# Patient Record
Sex: Male | Born: 1965 | Race: White | Hispanic: No | Marital: Married | Smoking: Never smoker
Health system: Southern US, Community
[De-identification: ages and names within clinical notes are randomized; demographics above are authoritative.]

## PROBLEM LIST (undated history)

## (undated) DIAGNOSIS — L02219 Cutaneous abscess of trunk, unspecified: Secondary | ICD-10-CM

## (undated) HISTORY — PX: PENILE PROSTHESIS IMPLANT: SHX240

---

## 2014-09-24 ENCOUNTER — Other Ambulatory Visit: Payer: Self-pay | Admitting: Otolaryngology

## 2014-09-24 ENCOUNTER — Other Ambulatory Visit (HOSPITAL_COMMUNITY)
Admission: RE | Admit: 2014-09-24 | Discharge: 2014-09-24 | Disposition: A | Discharge status: Home / Self Care | Payer: BLUE CROSS/BLUE SHIELD | Source: Ambulatory Visit | Attending: Otolaryngology | Admitting: Otolaryngology

## 2014-09-24 DIAGNOSIS — Q180 Sinus, fistula and cyst of branchial cleft: Secondary | ICD-10-CM | POA: Insufficient documentation

## 2015-03-30 ENCOUNTER — Inpatient Hospital Stay (HOSPITAL_COMMUNITY)
Admission: EM | Admit: 2015-03-30 | Discharge: 2015-04-02 | DRG: 675 | Disposition: A | Discharge status: Home / Self Care | Payer: BLUE CROSS/BLUE SHIELD | Attending: Urology | Admitting: Urology

## 2015-03-30 ENCOUNTER — Encounter (HOSPITAL_COMMUNITY): Payer: Self-pay | Admitting: *Deleted

## 2015-03-30 DIAGNOSIS — T8361XA Infection and inflammatory reaction due to implanted penile prosthesis, initial encounter: Principal | ICD-10-CM | POA: Diagnosis present

## 2015-03-30 DIAGNOSIS — N5089 Other specified disorders of the male genital organs: Secondary | ICD-10-CM | POA: Diagnosis not present

## 2015-03-30 DIAGNOSIS — I959 Hypotension, unspecified: Secondary | ICD-10-CM | POA: Diagnosis present

## 2015-03-30 DIAGNOSIS — Y831 Surgical operation with implant of artificial internal device as the cause of abnormal reaction of the patient, or of later complication, without mention of misadventure at the time of the procedure: Secondary | ICD-10-CM | POA: Diagnosis present

## 2015-03-30 DIAGNOSIS — R509 Fever, unspecified: Secondary | ICD-10-CM

## 2015-03-30 DIAGNOSIS — R197 Diarrhea, unspecified: Secondary | ICD-10-CM | POA: Diagnosis present

## 2015-03-30 DIAGNOSIS — N4822 Cellulitis of corpus cavernosum and penis: Secondary | ICD-10-CM

## 2015-03-30 DIAGNOSIS — N4889 Other specified disorders of penis: Secondary | ICD-10-CM | POA: Diagnosis present

## 2015-03-30 LAB — COMPREHENSIVE METABOLIC PANEL
ALT: 23 U/L (ref 17–63)
ANION GAP: 14 (ref 5–15)
AST: 20 U/L (ref 15–41)
Albumin: 3.4 g/dL — ABNORMAL LOW (ref 3.5–5.0)
Alkaline Phosphatase: 120 U/L (ref 38–126)
BUN: 10 mg/dL (ref 6–20)
CHLORIDE: 92 mmol/L — AB (ref 101–111)
CO2: 27 mmol/L (ref 22–32)
Calcium: 8.8 mg/dL — ABNORMAL LOW (ref 8.9–10.3)
Creatinine, Ser: 0.97 mg/dL (ref 0.61–1.24)
Glucose, Bld: 117 mg/dL — ABNORMAL HIGH (ref 65–99)
POTASSIUM: 3.7 mmol/L (ref 3.5–5.1)
SODIUM: 133 mmol/L — AB (ref 135–145)
Total Bilirubin: 2 mg/dL — ABNORMAL HIGH (ref 0.3–1.2)
Total Protein: 7.8 g/dL (ref 6.5–8.1)

## 2015-03-30 LAB — URINALYSIS, ROUTINE W REFLEX MICROSCOPIC
Glucose, UA: NEGATIVE mg/dL
Ketones, ur: >80 mg/dL — AB
NITRITE: POSITIVE — AB
SPECIFIC GRAVITY, URINE: 1.025 (ref 1.005–1.030)
UROBILINOGEN UA: 1 mg/dL (ref 0.0–1.0)
pH: 5.5 (ref 5.0–8.0)

## 2015-03-30 LAB — CBC WITH DIFFERENTIAL/PLATELET
BASOS ABS: 0 10*3/uL (ref 0.0–0.1)
BASOS PCT: 0 %
EOS ABS: 0 10*3/uL (ref 0.0–0.7)
Eosinophils Relative: 0 %
HEMATOCRIT: 47.6 % (ref 39.0–52.0)
HEMOGLOBIN: 16 g/dL (ref 13.0–17.0)
Lymphocytes Relative: 11 %
Lymphs Abs: 2 10*3/uL (ref 0.7–4.0)
MCH: 30.9 pg (ref 26.0–34.0)
MCHC: 33.6 g/dL (ref 30.0–36.0)
MCV: 92.1 fL (ref 78.0–100.0)
MONO ABS: 2.1 10*3/uL — AB (ref 0.1–1.0)
MONOS PCT: 11 %
NEUTROS ABS: 14.7 10*3/uL — AB (ref 1.7–7.7)
NEUTROS PCT: 78 %
Platelets: 254 10*3/uL (ref 150–400)
RBC: 5.17 MIL/uL (ref 4.22–5.81)
RDW: 13.3 % (ref 11.5–15.5)
WBC: 18.9 10*3/uL — ABNORMAL HIGH (ref 4.0–10.5)

## 2015-03-30 LAB — URINE MICROSCOPIC-ADD ON

## 2015-03-30 LAB — I-STAT CG4 LACTIC ACID, ED
LACTIC ACID, VENOUS: 2.21 mmol/L — AB (ref 0.5–2.0)
LACTIC ACID, VENOUS: 1.22 mmol/L (ref 0.5–2.0)

## 2015-03-30 MED ORDER — HYDROMORPHONE HCL 1 MG/ML IJ SOLN
1.0000 mg | INTRAMUSCULAR | Status: DC | PRN
  Administered 2015-03-30 – 2015-03-31 (×5): 1 mg via INTRAVENOUS
  Filled 2015-03-30 (×5): qty 1

## 2015-03-30 MED ORDER — ACETAMINOPHEN 325 MG PO TABS
650.0000 mg | ORAL_TABLET | ORAL | Status: DC | PRN
  Administered 2015-03-31 – 2015-04-01 (×4): 650 mg via ORAL
  Filled 2015-03-30 (×6): qty 2

## 2015-03-30 MED ORDER — ONDANSETRON HCL 4 MG/2ML IJ SOLN
4.0000 mg | INTRAMUSCULAR | Status: DC | PRN
Start: 2015-03-30 — End: 2015-04-02

## 2015-03-30 MED ORDER — PIPERACILLIN-TAZOBACTAM 3.375 G IVPB
3.3750 g | Freq: Three times a day (TID) | INTRAVENOUS | Status: DC
  Administered 2015-03-31 – 2015-04-02 (×8): 3.375 g via INTRAVENOUS
  Filled 2015-03-30 (×9): qty 50

## 2015-03-30 MED ORDER — ONDANSETRON HCL 4 MG/2ML IJ SOLN
4.0000 mg | Freq: Once | INTRAMUSCULAR | Status: AC
  Administered 2015-03-30: 4 mg via INTRAVENOUS
  Filled 2015-03-30: qty 2

## 2015-03-30 MED ORDER — HYDROMORPHONE HCL 1 MG/ML IJ SOLN
1.0000 mg | Freq: Once | INTRAMUSCULAR | Status: AC
  Administered 2015-03-30: 1 mg via INTRAVENOUS
  Filled 2015-03-30: qty 1

## 2015-03-30 MED ORDER — ZOLPIDEM TARTRATE 5 MG PO TABS
5.0000 mg | ORAL_TABLET | Freq: Every evening | ORAL | Status: DC | PRN
  Administered 2015-04-01 (×2): 5 mg via ORAL
  Filled 2015-03-30 (×2): qty 1

## 2015-03-30 MED ORDER — OXYCODONE HCL 5 MG PO TABS
5.0000 mg | ORAL_TABLET | ORAL | Status: DC | PRN
  Administered 2015-03-31: 5 mg via ORAL
  Filled 2015-03-30: qty 1

## 2015-03-30 MED ORDER — SODIUM CHLORIDE 0.9 % IV BOLUS (SEPSIS)
1000.0000 mL | INTRAVENOUS | Status: AC
  Administered 2015-03-30 (×2): 1000 mL via INTRAVENOUS

## 2015-03-30 MED ORDER — DEXTROSE-NACL 5-0.45 % IV SOLN
INTRAVENOUS | Status: DC
  Administered 2015-03-31 – 2015-04-01 (×2): via INTRAVENOUS

## 2015-03-30 MED ORDER — ACETAMINOPHEN 325 MG PO TABS
650.0000 mg | ORAL_TABLET | Freq: Once | ORAL | Status: AC
  Administered 2015-03-30: 650 mg via ORAL
  Filled 2015-03-30: qty 2

## 2015-03-30 MED ORDER — SODIUM CHLORIDE 0.9 % IV BOLUS (SEPSIS): 500.0000 mL | INTRAVENOUS | Status: DC

## 2015-03-30 MED ORDER — VANCOMYCIN HCL IN DEXTROSE 1-5 GM/200ML-% IV SOLN
1000.0000 mg | Freq: Once | INTRAVENOUS | Status: AC
  Administered 2015-03-30: 1000 mg via INTRAVENOUS
  Filled 2015-03-30: qty 200

## 2015-03-30 MED ORDER — SENNOSIDES-DOCUSATE SODIUM 8.6-50 MG PO TABS: 1.0000 | ORAL_TABLET | Freq: Every evening | ORAL | Status: DC | PRN

## 2015-03-30 MED ORDER — PIPERACILLIN-TAZOBACTAM 3.375 G IVPB 30 MIN
3.3750 g | Freq: Once | INTRAVENOUS | Status: AC
  Administered 2015-03-30: 3.375 g via INTRAVENOUS
  Filled 2015-03-30: qty 50

## 2015-03-30 NOTE — ED Notes (Signed)
Pt was sent here from [facility] for fever.  Pt states he has a penile implant that was displaced Thursday.  Friday he began experiencing chills/fevers and difficulty urinating.  Temp of 101.8 at home that reduced to 100.3 after tylenol at 1200.  Pt states he called the surgeon who placed the implant (in [place]) and they scheduled surgery this coming Fri in [place].

## 2015-03-30 NOTE — ED Provider Notes (Signed)
CSN: [id number]     Arrival date & time 03/30/15  1634 History   First MD Initiated Contact with Patient 03/30/15 1841     Chief Complaint  Patient presents with  . Fever    possible r/t implant     (Consider location/radiation/quality/duration/timing/severity/associated sxs/prior Treatment) HPI   Troy Morales is a 49 y.o. male who complains of several days, pain, swelling in the penis and scrotum/perineal area. He also complains of fever which started today. He suspects that his penile augmentation device, "slipped". Since then, his penis has had a deformed appearance. He denies dysuria, urinary retention, nausea, vomiting, headache or weakness. He plans on going to [place] next week to have the device removed. He is concerned that he will not be able to make it to that appointment. No other recent illnesses. There are no other known modifying factors.   Past Medical History  Diagnosis Date  . History of penile implant    Past Surgical History  Procedure Laterality Date  . Penile prosthesis implant     No family history on file. Social History  Substance Use Topics  . Smoking status: Never Smoker   . Smokeless tobacco: None  . Alcohol Use: No    Review of Systems  All other systems reviewed and are negative.     Allergies  Review of patient's allergies indicates no known allergies.  Home Medications   Prior to Admission medications   Not on File   BP 125/72 mmHg  Pulse 113  Temp(Src) 102.1 F (38.9 C) (Oral)  Resp 18  Ht 6\' 2"  (1.88 m)  Wt 250 lb (113.399 kg)  BMI 32.08 kg/m2  SpO2 98% Physical Exam  Constitutional: He is oriented to person, place, and time. He appears well-developed and well-nourished.  HENT:  Head: Normocephalic and atraumatic.  Right Ear: External ear normal.  Left Ear: External ear normal.  Eyes: Conjunctivae and EOM are normal. Pupils are equal, round, and reactive to light.  Neck: Normal range of motion and phonation normal.  Neck supple.  Cardiovascular: Normal rate, regular rhythm and normal heart sounds.   Pulmonary/Chest: Effort normal and breath sounds normal. He exhibits no bony tenderness.  Abdominal: Soft. There is no tenderness.  Genitourinary:  Marked perineal swelling with distortion of the penis and scrotum. No urethral discharge. No groin swelling or tenderness.  Musculoskeletal: Normal range of motion.  Neurological: He is alert and oriented to person, place, and time. No cranial nerve deficit or sensory deficit. He exhibits normal muscle tone. Coordination normal.  Skin: Skin is warm, dry and intact.  Psychiatric: He has a normal mood and affect. His behavior is normal. Judgment and thought content normal.  Nursing note and vitals reviewed.   ED Course  Procedures (including critical care time)        Labs Review Labs Reviewed  COMPREHENSIVE METABOLIC PANEL - Abnormal; Notable for the following:    Sodium 133 (*)    Chloride 92 (*)    Glucose, Bld 117 (*)    Calcium 8.8 (*)    Albumin 3.4 (*)    Total Bilirubin 2.0 (*)    All other components within normal limits  URINALYSIS, ROUTINE W REFLEX MICROSCOPIC (NOT AT [facility]) - Abnormal; Notable for the following:    Color, Urine ORANGE (*)    APPearance CLOUDY (*)    Hgb urine dipstick MODERATE (*)    Bilirubin Urine MODERATE (*)    Ketones, ur >80 (*)    Protein, ur >  300 (*)    Nitrite POSITIVE (*)    Leukocytes, UA TRACE (*)    All other components within normal limits  CBC WITH DIFFERENTIAL/PLATELET - Abnormal; Notable for the following:    WBC 18.9 (*)    Neutro Abs 14.7 (*)    Monocytes Absolute 2.1 (*)    All other components within normal limits  URINE MICROSCOPIC-ADD ON - Abnormal; Notable for the following:    Bacteria, UA FEW (*)    All other components within normal limits  I-STAT CG4 LACTIC ACID, ED - Abnormal; Notable for the following:    Lactic Acid, Venous 2.21 (*)    All other components within normal limits    CULTURE, BLOOD (ROUTINE X 2)  CULTURE, BLOOD (ROUTINE X 2)  URINE CULTURE  I-STAT CG4 LACTIC ACID, ED    Imaging Review No results found. I have personally reviewed and evaluated these images and lab results as part of my medical decision-making.   EKG Interpretation None      MDM   Final diagnoses:  Penis, cellulitis    Cellulitis, penis, with likely infected enhancement prosthesis. Doubt severe sepsis, metabolic instability or impending vascular collapse.  Nursing Notes Reviewed/ Care Coordinated, and agree without changes. Applicable Imaging Reviewed.  Interpretation of Laboratory Data incorporated into ED treatment  Plan- consultation and admission by urology for management.   Mancel Bale, MD 04/08/15 [phone]

## 2015-03-30 NOTE — H&P (Signed)
Urology History and Physical Exam  CC: Penile/groin swelling  HPI: 49 year old male presents to the emergency room with a 4-5 day history of significant penile swelling, pain, tenderness, fever, chills. The patient states that for the past few weeks at the very least he has had tenderness of his penis and intermittent fevers. The patient's history is notable for the fact that he has an enhancement implant on the shaft of his penis, with at least 2 revisions. All of these [place] took place [place] by a penile [employer] managerenhancement surgeon. The patient called this surgeon last week, told him about his problems, and was supposed to fly out later this week to have surgery. Unfortunately, the patient has had worsening of his situation since that time. A significant penile and scrotal swelling, suprapubic discomfort, fever, chills. Fevers have been up to 102 at home. The patient does have significant penile swelling and difficulty urinating. Most of the time, he feels like he is able to get his bladder empty. He has also had diarrhea. He has not been on antibiotics.  PMH: Past Medical History  Diagnosis Date  . History of penile implant     PSH: Past Surgical History  Procedure Laterality Date  . Penile prosthesis implant      Allergies: No Known Allergies  Medications:  (Not in a hospital admission)   Social History: Social History   Social History  . Marital Status: Unknown    Spouse Name: N/A  . Number of Children: N/A  . Years of Education: N/A   Occupational History  . Not on file.   Social History Main Topics  . Smoking status: Never Smoker   . Smokeless tobacco: Not on file  . Alcohol Use: No  . Drug Use: No  . Sexual Activity: Not on file   Other Topics Concern  . Not on file   Social History Narrative  . No narrative on file    Family History: No family history on file.  Review of Systems: Positive: Penile/protal/suprapubic pain, swelling,  difficulty urinating, fever, chills, diarrhea Negative:   A further 10 point review of systems was negative except what is listed in the HPI.                  Physical Exam: @VITALS2 @ General: No acute distress.  Awake. Head:  Normocephalic.  Atraumatic. ENT:  EOMI.  Mucous membranes moist Neck:  Supple.  No lymphadenopathy. CV:  S1 present. S2 present. Regular rate. Pulmonary: Equal effort bilaterally.  Clear to auscultation bilaterally. Abdomen: Soft.  Non- tender to palpation. There is minimal erythema in his suprapubic area Skin:  Normal turgor.  No visible rash. Extremity: No gross deformity of bilateral upper extremities.  No gross deformity of                             lower extremities. Neurologic: Alert. Appropriate mood.  Penis:  circumcised.  The penis is significantly swollen and tender. There is no crepitus. There is warmth and erythema. Urethra: Orthotopic meatus. Scrotum: Anterior scrotal swelling, erythema and edema. Posterior scrotum does not seem to be in vault. Testicles: Descended bilaterally.  Difficult to palpate through the scrotum, however   Studies:  Recent Labs     03/30/15  1707  HGB  16.0  WBC  18.9*  PLT  254    Recent Labs     03/30/15  1707  NA  133*  K  3.7  CL  92*  CO2  27  BUN  10  CREATININE  0.97  CALCIUM  8.8*  GFRNONAA  >60  GFRAA  >60     No results for input(s): INR, APTT in the last 72 hours.  Invalid input(s): PT   Invalid input(s): ABG    Assessment:  History of enhancement/augmentation implant to the penis several years ago, with history of 2 revisions due to displacement. Currently, the patient appears to have an infection of this device, possibly with abscess. There is significant scrotal and penile edema, fever, leukocytosis.  Plan: I have discussed this case with the patient and his wife, who attended him today. I have let him know that he should be treated with IV antibiotics, and urgent explant of this  unusual enhancement/augmentation implant. I will transfer the patient Wonda Olds to the urology service. He will be placed on broad-spectrum IV antibiotics. He has been drinking fluids tonight, so I will look at scheduling him tomorrow for explant of this device. Both the patient and his wife agree with this plan.

## 2015-03-31 ENCOUNTER — Encounter (HOSPITAL_COMMUNITY)
Admission: EM | Disposition: A | Discharge status: Home / Self Care | Payer: Self-pay | Source: Home / Self Care | Attending: Urology

## 2015-03-31 ENCOUNTER — Observation Stay (HOSPITAL_COMMUNITY): Payer: BLUE CROSS/BLUE SHIELD | Admitting: Registered Nurse

## 2015-03-31 ENCOUNTER — Observation Stay (HOSPITAL_COMMUNITY): Payer: BLUE CROSS/BLUE SHIELD

## 2015-03-31 ENCOUNTER — Encounter (HOSPITAL_COMMUNITY): Payer: Self-pay | Admitting: Registered Nurse

## 2015-03-31 HISTORY — PX: REMOVAL OF PENILE PROSTHESIS: SHX6059

## 2015-03-31 LAB — BASIC METABOLIC PANEL
ANION GAP: 8 (ref 5–15)
BUN: 12 mg/dL (ref 6–20)
CALCIUM: 8.7 mg/dL — AB (ref 8.9–10.3)
CO2: 31 mmol/L (ref 22–32)
Chloride: 98 mmol/L — ABNORMAL LOW (ref 101–111)
Creatinine, Ser: 0.76 mg/dL (ref 0.61–1.24)
GFR calc Af Amer: >60 mL/min (ref >60)
GFR calc non Af Amer: >60 mL/min (ref >60)
GLUCOSE: 111 mg/dL — AB (ref 65–99)
Potassium: 3.6 mmol/L (ref 3.5–5.1)
Sodium: 137 mmol/L (ref 135–145)

## 2015-03-31 LAB — TYPE AND SCREEN
ABO/RH(D): O POS
Antibody Screen: NEGATIVE

## 2015-03-31 LAB — URINE CULTURE

## 2015-03-31 LAB — ABO/RH: ABO/RH(D): O POS

## 2015-03-31 LAB — SURGICAL PCR SCREEN
MRSA, PCR: NEGATIVE
STAPHYLOCOCCUS AUREUS: NEGATIVE

## 2015-03-31 SURGERY — REMOVAL, PENILE PROSTHESIS
Anesthesia: General

## 2015-03-31 MED ORDER — DEXAMETHASONE SODIUM PHOSPHATE 10 MG/ML IJ SOLN
INTRAMUSCULAR | Status: AC
  Filled 2015-03-31: qty 1

## 2015-03-31 MED ORDER — ONDANSETRON HCL 4 MG/2ML IJ SOLN
INTRAMUSCULAR | Status: DC | PRN
Start: 2015-03-31 — End: 2015-03-31
  Administered 2015-03-31: 4 mg via INTRAVENOUS

## 2015-03-31 MED ORDER — LACTATED RINGERS IV SOLN
INTRAVENOUS | Status: DC
  Administered 2015-03-31: 11:00:00 via INTRAVENOUS
  Administered 2015-03-31: 1000 mL via INTRAVENOUS

## 2015-03-31 MED ORDER — SODIUM CHLORIDE 0.9 % IR SOLN
Status: DC | PRN
  Administered 2015-03-31: 500 mL

## 2015-03-31 MED ORDER — VANCOMYCIN HCL 500 MG IV SOLR
500.0000 mg | Freq: Once | INTRAVENOUS | Status: AC
  Administered 2015-03-31: 500 mg via INTRAVENOUS
  Filled 2015-03-31: qty 500

## 2015-03-31 MED ORDER — ACETAMINOPHEN 500 MG PO TABS
1000.0000 mg | ORAL_TABLET | Freq: Once | ORAL | Status: AC
  Administered 2015-03-31: 1000 mg via ORAL
  Filled 2015-03-31: qty 2

## 2015-03-31 MED ORDER — FENTANYL CITRATE (PF) 100 MCG/2ML IJ SOLN
INTRAMUSCULAR | Status: DC | PRN
  Administered 2015-03-31 (×6): 50 ug via INTRAVENOUS

## 2015-03-31 MED ORDER — OXYCODONE HCL 5 MG PO TABS
5.0000 mg | ORAL_TABLET | Freq: Once | ORAL | Status: DC | PRN
  Administered 2015-03-31: 5 mg via ORAL
  Filled 2015-03-31: qty 1

## 2015-03-31 MED ORDER — OXYCODONE HCL 5 MG PO TABS
10.0000 mg | ORAL_TABLET | ORAL | Status: DC | PRN
  Administered 2015-03-31 – 2015-04-02 (×9): 10 mg via ORAL
  Filled 2015-03-31 (×9): qty 2

## 2015-03-31 MED ORDER — ONDANSETRON HCL 4 MG/2ML IJ SOLN
INTRAMUSCULAR | Status: AC
  Filled 2015-03-31: qty 2

## 2015-03-31 MED ORDER — SULFAMETHOXAZOLE-TRIMETHOPRIM 800-160 MG PO TABS: 1.0000 | ORAL_TABLET | Freq: Two times a day (BID) | ORAL | Status: DC

## 2015-03-31 MED ORDER — PROPOFOL 10 MG/ML IV BOLUS
INTRAVENOUS | Status: AC
  Filled 2015-03-31: qty 20

## 2015-03-31 MED ORDER — IBUPROFEN 200 MG PO TABS
400.0000 mg | ORAL_TABLET | Freq: Four times a day (QID) | ORAL | Status: DC | PRN
  Administered 2015-03-31 – 2015-04-01 (×3): 400 mg via ORAL
  Filled 2015-03-31 (×3): qty 2

## 2015-03-31 MED ORDER — FENTANYL CITRATE (PF) 250 MCG/5ML IJ SOLN
INTRAMUSCULAR | Status: AC
  Filled 2015-03-31: qty 25

## 2015-03-31 MED ORDER — FENTANYL CITRATE (PF) 100 MCG/2ML IJ SOLN
INTRAMUSCULAR | Status: AC
  Filled 2015-03-31: qty 4

## 2015-03-31 MED ORDER — MIDAZOLAM HCL 2 MG/2ML IJ SOLN
INTRAMUSCULAR | Status: AC
  Filled 2015-03-31: qty 4

## 2015-03-31 MED ORDER — VANCOMYCIN HCL IN DEXTROSE 1-5 GM/200ML-% IV SOLN
1000.0000 mg | Freq: Three times a day (TID) | INTRAVENOUS | Status: DC
  Administered 2015-03-31: 1000 mg via INTRAVENOUS
  Filled 2015-03-31 (×3): qty 200

## 2015-03-31 MED ORDER — SODIUM CHLORIDE 0.9 % IR SOLN
Status: AC
  Filled 2015-03-31: qty 1

## 2015-03-31 MED ORDER — VANCOMYCIN HCL IN DEXTROSE 1-5 GM/200ML-% IV SOLN
1000.0000 mg | Freq: Three times a day (TID) | INTRAVENOUS | Status: DC
  Administered 2015-04-01 – 2015-04-02 (×4): 1000 mg via INTRAVENOUS
  Filled 2015-03-31 (×5): qty 200

## 2015-03-31 MED ORDER — LIDOCAINE HCL (CARDIAC) 20 MG/ML IV SOLN
INTRAVENOUS | Status: AC
  Filled 2015-03-31: qty 5

## 2015-03-31 MED ORDER — HYDROMORPHONE HCL 1 MG/ML IJ SOLN
0.2500 mg | INTRAMUSCULAR | Status: DC | PRN
  Administered 2015-03-31 (×2): 0.5 mg via INTRAVENOUS

## 2015-03-31 MED ORDER — VANCOMYCIN HCL IN DEXTROSE 1-5 GM/200ML-% IV SOLN
1000.0000 mg | Freq: Once | INTRAVENOUS | Status: AC
  Administered 2015-03-31: 1000 mg via INTRAVENOUS

## 2015-03-31 MED ORDER — HYDROMORPHONE HCL 1 MG/ML IJ SOLN
INTRAMUSCULAR | Status: AC
  Filled 2015-03-31: qty 1

## 2015-03-31 MED ORDER — MIDAZOLAM HCL 5 MG/5ML IJ SOLN
INTRAMUSCULAR | Status: DC | PRN
  Administered 2015-03-31: 2 mg via INTRAVENOUS

## 2015-03-31 MED ORDER — LIDOCAINE HCL (CARDIAC) 10 MG/ML IV SOLN
INTRAVENOUS | Status: DC | PRN
  Administered 2015-03-31: 50 mg via INTRAVENOUS

## 2015-03-31 MED ORDER — PROPOFOL 10 MG/ML IV BOLUS
INTRAVENOUS | Status: AC
Start: 2015-03-31 — End: 2015-03-31
  Filled 2015-03-31: qty 20

## 2015-03-31 MED ORDER — HYDROMORPHONE HCL 2 MG/ML IJ SOLN
2.0000 mg | INTRAMUSCULAR | Status: DC | PRN
  Administered 2015-03-31 – 2015-04-02 (×16): 2 mg via INTRAVENOUS
  Filled 2015-03-31 (×16): qty 1

## 2015-03-31 MED ORDER — OXYCODONE HCL 5 MG PO TABS: 5.0000 mg | ORAL_TABLET | ORAL | Status: DC | PRN

## 2015-03-31 MED ORDER — PROPOFOL 10 MG/ML IV BOLUS
INTRAVENOUS | Status: DC | PRN
  Administered 2015-03-31: 250 mg via INTRAVENOUS

## 2015-03-31 SURGICAL SUPPLY — 39 items
BAG URINE DRAINAGE (UROLOGICAL SUPPLIES) ×3 IMPLANT
BANDAGE COBAN STERILE 2 (GAUZE/BANDAGES/DRESSINGS) IMPLANT
BENZOIN TINCTURE PRP APPL 2/3 (GAUZE/BANDAGES/DRESSINGS) ×3 IMPLANT
BLADE HEX COATED 2.75 (ELECTRODE) ×3 IMPLANT
BNDG GAUZE ELAST 4 BULKY (GAUZE/BANDAGES/DRESSINGS) ×3 IMPLANT
CATH FOLEY 2WAY SLVR  5CC 16FR (CATHETERS) ×2
CATH FOLEY 2WAY SLVR 5CC 16FR (CATHETERS) ×1 IMPLANT
CLOSURE WOUND 1/2 X4 (GAUZE/BANDAGES/DRESSINGS) ×1
COVER MAYO STAND STRL (DRAPES) ×3 IMPLANT
COVER SURGICAL LIGHT HANDLE (MISCELLANEOUS) ×6 IMPLANT
DISSECTOR ROUND CHERRY 3/8 STR (MISCELLANEOUS) IMPLANT
DRAIN CHANNEL 15F RND FF 3/16 (WOUND CARE) ×3 IMPLANT
DRAIN PENROSE 18X1/4 LTX STRL (WOUND CARE) ×3 IMPLANT
DRAPE LAPAROTOMY T 102X78X121 (DRAPES) ×3 IMPLANT
DRSG TEGADERM 4X4.75 (GAUZE/BANDAGES/DRESSINGS) ×3 IMPLANT
EVACUATOR SILICONE 100CC (DRAIN) ×3 IMPLANT
GAUZE SPONGE 4X4 12PLY STRL (GAUZE/BANDAGES/DRESSINGS) ×3 IMPLANT
GLOVE BIOGEL M 8.0 STRL (GLOVE) ×3 IMPLANT
GOWN STRL REUS W/TWL XL LVL3 (GOWN DISPOSABLE) ×3 IMPLANT
KIT BASIN OR (CUSTOM PROCEDURE TRAY) ×3 IMPLANT
NS IRRIG 1000ML POUR BTL (IV SOLUTION) ×3 IMPLANT
PACK GENERAL/GYN (CUSTOM PROCEDURE TRAY) ×3 IMPLANT
PLUG CATH AND CAP STER (CATHETERS) ×3 IMPLANT
RETRACTOR WILSON SYSTEM (INSTRUMENTS) IMPLANT
SOL PREP POV-IOD 4OZ 10% (MISCELLANEOUS) ×3 IMPLANT
SOL PREP PROV IODINE SCRUB 4OZ (MISCELLANEOUS) ×3 IMPLANT
SPONGE LAP 4X18 X RAY DECT (DISPOSABLE) IMPLANT
STRIP CLOSURE SKIN 1/2X4 (GAUZE/BANDAGES/DRESSINGS) ×2 IMPLANT
SUPPORT SCROTAL LG STRP (MISCELLANEOUS) ×2 IMPLANT
SUPPORTER ATHLETIC LG (MISCELLANEOUS) ×1
SURGILUBE 3G PEEL PACK STRL (MISCELLANEOUS) ×12 IMPLANT
SUT ETHILON 2 0 PS N (SUTURE) ×6 IMPLANT
SUT VIC AB 2-0 UR5 27 (SUTURE) ×18 IMPLANT
SUT VIC AB 3-0 SH 27 (SUTURE) ×4
SUT VIC AB 3-0 SH 27XBRD (SUTURE) ×2 IMPLANT
SYR 20CC LL (SYRINGE) ×3 IMPLANT
SYR 50ML LL SCALE MARK (SYRINGE) ×6 IMPLANT
TOWEL OR 17X26 10 PK STRL BLUE (TOWEL DISPOSABLE) ×6 IMPLANT
WATER STERILE IRR 1500ML POUR (IV SOLUTION) ×3 IMPLANT

## 2015-03-31 NOTE — Anesthesia Procedure Notes (Signed)
Procedure Name: LMA Insertion Date/Time: 03/31/2015 11:54 AM Performed by: Early OsmondEARGLE, Dillon Livermore E Pre-anesthesia Checklist: Patient identified, Emergency Drugs available, Suction available and Patient being monitored Patient Re-evaluated:Patient Re-evaluated prior to inductionOxygen Delivery Method: Circle system utilized Preoxygenation: Pre-oxygenation with 100% oxygen Intubation Type: IV induction Ventilation: Mask ventilation without difficulty LMA: LMA inserted LMA Size: 5.0 Number of attempts: 1 Placement Confirmation: positive ETCO2 and breath sounds checked- equal and bilateral Dental Injury: Teeth and Oropharynx as per pre-operative assessment

## 2015-03-31 NOTE — Anesthesia Preprocedure Evaluation (Addendum)
Anesthesia Evaluation  Patient identified by MRN, date of birth, ID band Patient awake    Reviewed: Allergy & Precautions, H&P , NPO status , Patient's Chart, lab work & pertinent test results  Airway Mallampati: II  TM Distance: >3 FB Neck ROM: Full    Dental no notable dental hx. (+) Teeth Intact, Dental Advisory Given   Pulmonary neg pulmonary ROS,    Pulmonary exam normal breath sounds clear to auscultation       Cardiovascular negative cardio ROS   Rhythm:Regular Rate:Normal     Neuro/Psych negative neurological ROS  negative psych ROS   GI/Hepatic negative GI ROS, Neg liver ROS,   Endo/Other  negative endocrine ROS  Renal/GU negative Renal ROS  negative genitourinary   Musculoskeletal   Abdominal   Peds  Hematology negative hematology ROS (+)   Anesthesia Other Findings   Reproductive/Obstetrics negative OB ROS                            Anesthesia Physical Anesthesia Plan  ASA: II  Anesthesia Plan: General   Post-op Pain Management:    Induction: Intravenous  Airway Management Planned: Oral ETT and LMA  Additional Equipment:   Intra-op Plan:   Post-operative Plan: Extubation in OR  Informed Consent: I have reviewed the patients History and Physical, chart, labs and discussed the procedure including the risks, benefits and alternatives for the proposed anesthesia with the patient or authorized representative who has indicated his/her understanding and acceptance.   Dental advisory given  Plan Discussed with: CRNA  Anesthesia Plan Comments:        Anesthesia Quick Evaluation

## 2015-03-31 NOTE — Anesthesia Postprocedure Evaluation (Signed)
  Anesthesia Post-op Note  Patient: Troy Morales  Procedure(s) Performed: Procedure(s): REMOVAL OF PENILE PROSTHESIS (N/A)  Patient Location: PACU  Anesthesia Type:General  Level of Consciousness: awake and alert   Airway and Oxygen Therapy: Patient Spontanous Breathing  Post-op Pain: Controlled  Post-op Assessment: Post-op Vital signs reviewed, Patient's Cardiovascular Status Stable and Respiratory Function Stable  Post-op Vital Signs: Reviewed  Filed Vitals:   03/31/15 1330  BP: 146/84  Pulse: 96  Temp: 36.9 C  Resp: 18    Complications: No apparent anesthesia complications

## 2015-03-31 NOTE — Op Note (Signed)
Preoperative diagnosis: Infected penile augmentation implant  Postoperative diagnosis: Same   Procedure: Excision of infected penile augmentation implant    Surgeon: Bertram MillardStephen M. Ananiah Maciolek, M.D.   Anesthesia: Gen.   Complications: None  Specimen(s): Culture of purulent drainage  Drain(s): 15 mm Blake drain in right inferior scrotum  Indications: 49 year old male with a history of a penile augmentation implant-silicone in nature. This was [place] with 2 revisions since that time. The patient [facility] yesterday with a fever and a 4 day history of swelling and tenderness of his penis and scrotum. It was felt that the patient had an infected implant. As the patient had significant issues with this, it was recommended that he have urgent management/excision of this. The patient was admitted and has been on IV antibiotics. He presents at this time for excision of this implant.    Technique and findings: The patient was properly identified in the holding area. He has been on both IV Zosyn and vancomycin. He was taken the operating room. General anesthesia was administered with the LMA. He was placed in the supine position. Genitalia and perineum as well as lower abdomen were prepped and draped. Timeout was performed.  I placed a 316 French Foley catheter quite easily through his significantly swollen/shortened penis. 20 mL of water was used to fill the balloon, as patient has a history of pulling catheters out. The bladder was drained of approximately 100 mL of urine. I then used the ninth 2 in size in the patient's infrapubic incision. The incision itself was approximately 7 cm in length. I dissected down inferiorly and posteriorly with the Bovie. I eventually came onto the pus filled cavity. Approximate 200 mL of purulent matter was removed. This was cultured for both routine and anaerobic cultures. The cavity was quite  large, and it was very easy to see this implant. The implant was easily grasped and removed without having to cut any sutures. It was evident that underneath the implant, there was a mesh sheet lying on the dorsum of the corporal bodies. Most of this was free-floating, and easily excised. However, it was an approximately 3 cm area where this mesh was tightly applied to the corporal bodies. This was carefully excised with sharp dissection. There was perhaps a tiny amount of this that was remaining at the time of the completion of the case, but I removed all of the mesh that I could see. Remaining sutures were also excised. The cavity was then carefully irrigated with antibiotic irrigation, 1 L. It was then also irrigated with 1 L of saline. Small bleeders were electrocoagulated. I then brought a 15 mm Blake drain through the right inferior scrotum through a small stab incision. This was sutured to the skin with a 2-0 nylon. Following inspection of the cavity, and finding it to be hemostatic, I then used interrupted 2-0 Vicryl's to reapproximate the subcutaneous tissue in 2 separate layers. The skin was loosely reapproximated with 2-0 nylon sutures placed in a vertical mattress fashion. At this point, dry sterile/fluff dressings were placed underneath a athletic supporter. The Foley was to dependent drainage. Per the patient tolerated procedure well. He was awakened and taken to the PACU in stable condition.

## 2015-03-31 NOTE — Progress Notes (Signed)
ANTIBIOTIC CONSULT NOTE - INITIAL  Pharmacy Consult for Vancomycin and Zosyn  Indication: Wound infection  No Known Allergies  Patient Measurements: Height: 6\' 2"  (188 cm) Weight: 255 lb 14.4 oz (116.075 kg) IBW/kg (Calculated) : 82.2 Adjusted Body Weight:   Vital Signs: Temp: 99 F (37.2 C) (10/30 2350) Temp Source: Oral (10/30 2350) BP: 147/78 mmHg (10/30 2350) Pulse Rate: 92 (10/30 2350) Intake/Output from previous day: 10/30 0701 - 10/31 0700 In: [id number] [P.O.:240; I.V.:20250] Out: -  Intake/Output from this shift: Total I/O In: [id number] [P.O.:240; I.V.:20250] Out: -   Labs:  Recent Labs  03/30/15 1707  WBC 18.9*  HGB 16.0  PLT 254  CREATININE 0.97   Estimated Creatinine Clearance: 126.2 mL/min (by C-G formula based on Cr of 0.97). No results for input(s): VANCOTROUGH, VANCOPEAK, VANCORANDOM, GENTTROUGH, GENTPEAK, GENTRANDOM, TOBRATROUGH, TOBRAPEAK, TOBRARND, AMIKACINPEAK, AMIKACINTROU, AMIKACIN in the last 72 hours.   Microbiology: No results found for this or any previous visit (from the past 720 hour(s)).  Medical History: Past Medical History  Diagnosis Date  . History of penile implant     Medications:  Anti-infectives    Start     Dose/Rate Route Frequency Ordered Stop   03/31/15 0600  vancomycin (VANCOCIN) IVPB 1000 mg/200 mL premix     1,000 mg 200 mL/hr over 60 Minutes Intravenous Every 8 hours 03/31/15 0011     03/31/15 0100  piperacillin-tazobactam (ZOSYN) IVPB 3.375 g     3.375 g 12.5 mL/hr over 240 Minutes Intravenous 3 times per day 03/30/15 2356     03/31/15 0015  vancomycin (VANCOCIN) 500 mg in sodium chloride 0.9 % 100 mL IVPB     500 mg 100 mL/hr over 60 Minutes Intravenous  Once 03/31/15 0010 03/31/15 0138   03/30/15 2015  piperacillin-tazobactam (ZOSYN) IVPB 3.375 g     3.375 g 100 mL/hr over 30 Minutes Intravenous  Once 03/30/15 2009 03/30/15 2303   03/30/15 2015  vancomycin (VANCOCIN) IVPB 1000 mg/200 mL premix     1,000 mg 200  mL/hr over 60 Minutes Intravenous  Once 03/30/15 2009 03/30/15 2303     Assessment: Patient with penile implant infection.  MD wants Vancomycin and Zosyn per pharmacy and plan for explant today.  Patient with weight > 100kg and 1gm vancomycin given in ED.  Patient with very good renal function.  Goal of Therapy:  Vancomycin trough level 15-20 mcg/ml  Zosyn based on renal function Appropriate antibiotic dosing for renal function; eradication of infection   Plan:  Measure antibiotic drug levels at steady state Follow up culture results Vancomycin 500mg  (to make 1500mg  total), then 1gm iv q8hr  Zosyn 3.375g IV Q8H infused over 4hrs.   Darlina GuysGrimsley Jr, Jacquenette ShoneJulian Crowford 03/31/2015,3:21 AM

## 2015-03-31 NOTE — Transfer of Care (Signed)
Immediate Anesthesia Transfer of Care Note  Patient: Troy Morales  Procedure(s) Performed: Procedure(s): REMOVAL OF PENILE PROSTHESIS (N/A)  Patient Location: PACU  Anesthesia Type:General  Level of Consciousness:  sedated, patient cooperative and responds to stimulation  Airway & Oxygen Therapy:Patient Spontanous Breathing and Patient connected to face mask oxgen  Post-op Assessment:  Report given to PACU RN and Post -op Vital signs reviewed and stable  Post vital signs:  Reviewed and stable  Last Vitals:  Filed Vitals:   03/31/15 1303  BP: 141/90  Pulse: 106  Temp: 37.3 C  Resp: 22    Complications: No apparent anesthesia complications

## 2015-04-01 DIAGNOSIS — T8361XA Infection and inflammatory reaction due to implanted penile prosthesis, initial encounter: Secondary | ICD-10-CM | POA: Diagnosis present

## 2015-04-01 DIAGNOSIS — R197 Diarrhea, unspecified: Secondary | ICD-10-CM | POA: Diagnosis present

## 2015-04-01 DIAGNOSIS — Y831 Surgical operation with implant of artificial internal device as the cause of abnormal reaction of the patient, or of later complication, without mention of misadventure at the time of the procedure: Secondary | ICD-10-CM | POA: Diagnosis present

## 2015-04-01 DIAGNOSIS — N5089 Other specified disorders of the male genital organs: Secondary | ICD-10-CM | POA: Diagnosis present

## 2015-04-01 DIAGNOSIS — I959 Hypotension, unspecified: Secondary | ICD-10-CM | POA: Diagnosis present

## 2015-04-01 DIAGNOSIS — N4889 Other specified disorders of penis: Secondary | ICD-10-CM | POA: Diagnosis present

## 2015-04-01 NOTE — Progress Notes (Signed)
1 Day Post-Op  Subjective:  1 - Infected Penile Augmentation Device - s/p operative removal / debridement / abscess drainage 03/13/15 if clinically infected penile augmentation device. Troy Morales 10/30 - pending. Abscess CX 10/31 - pending. On empiric Vanc + Zosyn.  Today "Troy Morales" is feeling improved. Fever curve trending down. No specific complaints. JP drianage appears to be trending down and becoming less purlulent.   Objective: Vital signs in last 24 hours: Temp:  [97.7 F (36.5 C)-101.3 F (38.5 C)] 97.7 F (36.5 C) (11/01 0538) Pulse Rate:  [75-106] 75 (11/01 0538) Resp:  [14-22] 18 (11/01 0538) BP: (120-159)/(73-90) 120/73 mmHg (11/01 0538) SpO2:  [96 %-100 %] 98 % (11/01 0538) Last BM Date: 03/30/15  Intake/Output from previous day: 10/31 0701 - 11/01 0700 In: 1496.7 [I.V.:1196.7; IV Piggyback:300] Out: 367 [Urine:290; Drains:77] Intake/Output this shift:    General appearance: alert, cooperative and appears stated age Head: Normocephalic, without obvious abnormality, atraumatic Nose: Nares normal. Septum midline. Mucosa normal. No drainage or sinus tenderness. Throat: lips, mucosa, and tongue normal; teeth and gums normal Neck: supple, symmetrical, trachea midline Back: symmetric, no curvature. ROM normal. No CVA tenderness. Resp: non-labored Cardio: Nl rate GI: soft, non-tender; bowel sounds normal; no masses,  no organomegaly Male genitalia: normal, significant shaft edema that is non-fluctent and erytehma tracking to pre-pubic fat pad that is non-fluctuent. No crepitius. No necrosis. JP with scant serosanguinout output. No expressible pus from incision sites.  Pulses: 2+ and symmetric Skin: Skin color, texture, turgor normal. No rashes or lesions Lymph nodes: Cervical, supraclavicular, and axillary nodes normal. Neurologic: Grossly normal Incision/Wound: as per above.   Lab Results:   Recent Labs  03/30/15 1707  WBC 18.9*  HGB 16.0  HCT 47.6  PLT 254    BMET  Recent Labs  03/30/15 1707 03/31/15 0442  NA 133* 137  K 3.7 3.6  CL 92* 98*  CO2 27 31  GLUCOSE 117* 111*  BUN 10 12  CREATININE 0.97 0.76  CALCIUM 8.8* 8.7*   PT/INR No results for input(s): LABPROT, INR in the last 72 hours. ABG No results for input(s): PHART, HCO3 in the last 72 hours.  Invalid input(s): PCO2, PO2  Studies/Results: Dg Chest 1 View  03/31/2015  CLINICAL DATA:  Fever and chills. EXAM: CHEST 1 VIEW COMPARISON:  None. FINDINGS: Mediastinum and hilar structures are normal. Cardiomegaly with normal pulmonary vascularity. Mild bilateral interstitial prominence noted. Mild pneumonitis cannot be excluded. No pleural effusion or pneumothorax. IMPRESSION: 1. Mild bilateral interstitial prominence. Mild pneumonitis cannot be excluded. 2. Cardiomegaly with normal pulmonary vascularity. Electronically Signed   By: Maisie Fus  Register   On: 03/31/2015 07:09    Anti-infectives: Anti-infectives    Start     Dose/Rate Route Frequency Ordered Stop   04/01/15 0200  vancomycin (VANCOCIN) IVPB 1000 mg/200 mL premix     1,000 mg 200 mL/hr over 60 Minutes Intravenous Every 8 hours 03/31/15 1850     03/31/15 1900  vancomycin (VANCOCIN) IVPB 1000 mg/200 mL premix     1,000 mg 200 mL/hr over 60 Minutes Intravenous  Once 03/31/15 1851 03/31/15 1958   03/31/15 1214  polymyxin B 500,000 Units, bacitracin 50,000 Units in sodium chloride irrigation 0.9 % 500 mL irrigation  Status:  Discontinued       As needed 03/31/15 1234 03/31/15 1259   03/31/15 0600  vancomycin (VANCOCIN) IVPB 1000 mg/200 mL premix  Status:  Discontinued     1,000 mg 200 mL/hr over 60 Minutes Intravenous Every  8 hours 03/31/15 0011 03/31/15 1850   03/31/15 0100  piperacillin-tazobactam (ZOSYN) IVPB 3.375 g     3.375 g 12.5 mL/hr over 240 Minutes Intravenous 3 times per day 03/30/15 2356     03/31/15 0015  vancomycin (VANCOCIN) 500 mg in sodium chloride 0.9 % 100 mL IVPB     500 mg 100 mL/hr over 60  Minutes Intravenous  Once 03/31/15 0010 03/31/15 0138   03/31/15 0000  sulfamethoxazole-trimethoprim (BACTRIM DS,SEPTRA DS) 800-160 MG tablet     1 tablet Oral 2 times daily 03/31/15 1303     03/30/15 2015  piperacillin-tazobactam (ZOSYN) IVPB 3.375 g     3.375 g 100 mL/hr over 30 Minutes Intravenous  Once 03/30/15 2009 03/30/15 2303   03/30/15 2015  vancomycin (VANCOCIN) IVPB 1000 mg/200 mL premix     1,000 mg 200 mL/hr over 60 Minutes Intravenous  Once 03/30/15 2009 03/30/15 2303      Assessment/Plan:  1 - Infected Penile Augmentation Device - doing well POD 1, improving clinically.  Discussed goals of fever free x 24 hrs and hopefully preliminary CX data to allow for continuation of PO ABX upon discharge. Remain in house on current ABX and JP to stay for now.   [facility], Krystol Morales 04/01/2015

## 2015-04-01 NOTE — Care Management Note (Signed)
Case Management Note  Patient Details  Name: Troy Morales MRN: [id number] Date of Birth: 09-06-1965  Subjective/Objective: 49 y/o m admitted w/Infected penile implant.POD#1.JP,iv abx,iv dilaudid.From home.No insurance will provide resources.                   Action/Plan:d/c plan home.   Expected Discharge Date:                  Expected Discharge Plan:  Home/Self Care  In-House Referral:     Discharge planning Services  CM Consult  Post Acute Care Choice:    Choice offered to:     DME Arranged:    DME Agency:     HH Arranged:    HH Agency:     Status of Service:  In process, will continue to follow  Medicare Important Message Given:    Date Medicare IM Given:    Medicare IM give by:    Date Additional Medicare IM Given:    Additional Medicare Important Message give by:     If discussed at Long Length of Stay Meetings, dates discussed:    Additional Comments:  Lanier ClamMahabir, Chianti Goh, RN 04/01/2015, 11:38 AM

## 2015-04-01 NOTE — Clinical Documentation Improvement (Signed)
Urology  Patient presented with temp of 101.3, pulse (101 /106/ 100), WBC 18.9 and lactic acid of 2.21, did the patient meet criteria for any of the following diagnoses on admit?  Thank  you    Sepsis   Sirs   Other  Clinically Undetermined  Document any associated diagnoses/conditions.   Supporting Information: Surgical removal of implant, IV Vancomycin/Zosyn   Please exercise your independent, professional judgment when responding. A specific answer is not anticipated or expected.   Thank You,  Lavonda JumboLawanda J Britaney Espaillat [facility] Information [facility] [place] [phone]

## 2015-04-02 MED ORDER — DOXYCYCLINE HYCLATE 100 MG PO TABS: 100.0000 mg | ORAL_TABLET | Freq: Two times a day (BID) | ORAL | Status: AC

## 2015-04-02 MED ORDER — OXYCODONE HCL 10 MG PO TABS: 10.0000 mg | ORAL_TABLET | ORAL | Status: DC | PRN

## 2015-04-02 MED ORDER — ZOLPIDEM TARTRATE 5 MG PO TABS: ORAL_TABLET | ORAL | Status: AC

## 2015-04-02 NOTE — Discharge Instructions (Signed)
1. Drain bulb daily  2. OK to use hot pack to scrotum/penis  3. Take all of doxycycline  4. Call for recurrent fever

## 2015-04-02 NOTE — Progress Notes (Signed)
Patient discharged to home with belongings, IVs removed, AVS and prescriptions given to patient. Discharge instructions explained, patient denied questions. Patient wanted to walk down to medical records to retrieve medical information. This RN walked down with patient to Medical Records, patient stable at time of discharge.

## 2015-04-03 LAB — CULTURE, ROUTINE-ABSCESS

## 2015-04-03 NOTE — Discharge Summary (Signed)
Patient ID: Troy Morales MRN: [id number] DOB/AGE: [birth year] [age] y.o.  Admit date: 03/30/2015 Discharge date: 04/03/2015  Primary Care Physician:  No primary care provider on file.  Discharge Diagnoses:   Present on Admission:  . Infection of penile implant (HCC) . 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     oxyCODONE 5 MG immediate release tablet  Commonly known as:  Oxy IR/ROXICODONE  Take 1 tablet (5 mg total) by mouth every 4 (four) hours as needed for moderate pain.     Oxycodone HCl 10 MG Tabs  Take 1 tablet (10 mg total) by mouth every 4 (four) hours as needed for moderate pain.     Testosterone 30 MG Misc  Place 2 each inside cheek daily.     zolpidem 5 MG tablet  Commonly known as:  AMBIEN  10 mg tabs Take 1 po qhs prn insomnia         Significant Diagnostic Studies:  Dg Chest 1 View  03/31/2015  CLINICAL DATA:  Fever and chills. EXAM: CHEST 1 VIEW COMPARISON:  None. FINDINGS: Mediastinum and hilar structures are normal. Cardiomegaly with normal pulmonary vascularity. Mild bilateral interstitial prominence noted. Mild pneumonitis cannot be excluded. No pleural effusion or pneumothorax. IMPRESSION: 1. Mild bilateral interstitial prominence. Mild pneumonitis cannot be excluded. 2. Cardiomegaly with normal pulmonary vascularity. Electronically Signed   By: Maisie Fushomas  Register   On: 03/31/2015 07:09    Brief H and P: For complete details please refer to admission H and P, but in brief the patient presented to the emergency room with a several-day history  of penile pain, swelling, redness and fever. He was admitted for IV antibiotic management and surgical management of an infected penile augmentation.  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  Physical Exam: General: Alert and awake oriented x3 not in any acute distress. HEENT: anicteric sclera, pupils reactive to light and accommodation CVS: S1-S2 clear no murmur rubs or gallops Chest: clear to auscultation bilaterally, no wheezing rales or rhonchi Abdomen: soft nontender, nondistended, normal bowel sounds, no organomegaly Extremities: no cyanosis, clubbing or edema noted bilaterally Neuro: Cranial nerves II-XII intact, no focal neurological deficits  Disposition:  Home  Diet:  No restrictions  Activity:  Gradually increase   Disposition and Follow-up:     Discharge Instructions    Discharge patient    Complete by:  As directed             He will follow-up 1 week in my office for drain removal  TESTS THAT NEED FOLLOW-UP  Follow-up of cultures  DISCHARGE FOLLOW-UP  Follow-up Information    Follow up with Chelsea Aus, MD.   Specialty:  Urology   Why:  we will call you   Contact information:   [address] [place] [address] [phone]       Time spent on Discharge:  20 minutes  Signed: Chelsea Aus 04/03/2015, 2:50 PM

## 2015-04-04 LAB — CULTURE, BLOOD (ROUTINE X 2)
CULTURE: NO GROWTH
Culture: NO GROWTH

## 2015-04-05 LAB — ANAEROBIC CULTURE

## 2015-04-30 ENCOUNTER — Other Ambulatory Visit: Payer: Self-pay | Admitting: Urology

## 2015-04-30 ENCOUNTER — Encounter (HOSPITAL_BASED_OUTPATIENT_CLINIC_OR_DEPARTMENT_OTHER): Payer: Self-pay | Admitting: *Deleted

## 2015-04-30 NOTE — Progress Notes (Signed)
NPO AFTER MN.  ARRIVE AT 0815.  NEEDS HG.   

## 2015-05-01 ENCOUNTER — Ambulatory Visit (HOSPITAL_BASED_OUTPATIENT_CLINIC_OR_DEPARTMENT_OTHER): Payer: BLUE CROSS/BLUE SHIELD | Admitting: Anesthesiology

## 2015-05-01 ENCOUNTER — Encounter (HOSPITAL_BASED_OUTPATIENT_CLINIC_OR_DEPARTMENT_OTHER)
Admission: RE | Disposition: A | Discharge status: Home / Self Care | Payer: Self-pay | Source: Ambulatory Visit | Attending: Urology

## 2015-05-01 ENCOUNTER — Ambulatory Visit (HOSPITAL_BASED_OUTPATIENT_CLINIC_OR_DEPARTMENT_OTHER)
Admission: RE | Admit: 2015-05-01 | Discharge: 2015-05-01 | Disposition: A | Discharge status: Home / Self Care | Payer: BLUE CROSS/BLUE SHIELD | Source: Ambulatory Visit | Attending: Urology | Admitting: Urology

## 2015-05-01 ENCOUNTER — Encounter (HOSPITAL_BASED_OUTPATIENT_CLINIC_OR_DEPARTMENT_OTHER): Payer: Self-pay

## 2015-05-01 DIAGNOSIS — Z6832 Body mass index (BMI) 32.0-32.9, adult: Secondary | ICD-10-CM | POA: Diagnosis not present

## 2015-05-01 DIAGNOSIS — L02211 Cutaneous abscess of abdominal wall: Secondary | ICD-10-CM | POA: Diagnosis present

## 2015-05-01 DIAGNOSIS — E669 Obesity, unspecified: Secondary | ICD-10-CM | POA: Insufficient documentation

## 2015-05-01 DIAGNOSIS — T8361XD Infection and inflammatory reaction due to implanted penile prosthesis, subsequent encounter: Secondary | ICD-10-CM

## 2015-05-01 DIAGNOSIS — Z79899 Other long term (current) drug therapy: Secondary | ICD-10-CM | POA: Diagnosis not present

## 2015-05-01 HISTORY — DX: Cutaneous abscess of trunk, unspecified: L02.219

## 2015-05-01 HISTORY — PX: INCISION AND DRAINAGE ABSCESS: SHX5864

## 2015-05-01 LAB — POCT HEMOGLOBIN-HEMACUE: Hemoglobin: 15.2 g/dL (ref 13.0–17.0)

## 2015-05-01 SURGERY — INCISION AND DRAINAGE, ABSCESS
Anesthesia: General

## 2015-05-01 MED ORDER — FENTANYL CITRATE (PF) 100 MCG/2ML IJ SOLN
INTRAMUSCULAR | Status: AC
  Filled 2015-05-01: qty 2

## 2015-05-01 MED ORDER — CEFAZOLIN SODIUM-DEXTROSE 2-3 GM-% IV SOLR
2.0000 g | INTRAVENOUS | Status: AC
  Administered 2015-05-01: 2 g via INTRAVENOUS
  Filled 2015-05-01: qty 50

## 2015-05-01 MED ORDER — CEFAZOLIN SODIUM 1-5 GM-% IV SOLN
1.0000 g | INTRAVENOUS | Status: DC
  Filled 2015-05-01: qty 50

## 2015-05-01 MED ORDER — FENTANYL CITRATE (PF) 100 MCG/2ML IJ SOLN
25.0000 ug | INTRAMUSCULAR | Status: DC | PRN
  Filled 2015-05-01: qty 1

## 2015-05-01 MED ORDER — ONDANSETRON HCL 4 MG/2ML IJ SOLN
INTRAMUSCULAR | Status: AC
  Filled 2015-05-01: qty 2

## 2015-05-01 MED ORDER — SODIUM CHLORIDE 0.9 % IR SOLN
Status: DC | PRN
  Administered 2015-05-01: 500 mL

## 2015-05-01 MED ORDER — MIDAZOLAM HCL 5 MG/5ML IJ SOLN
INTRAMUSCULAR | Status: DC | PRN
  Administered 2015-05-01: 2 mg via INTRAVENOUS

## 2015-05-01 MED ORDER — MIDAZOLAM HCL 2 MG/2ML IJ SOLN
INTRAMUSCULAR | Status: AC
  Filled 2015-05-01: qty 2

## 2015-05-01 MED ORDER — OXYCODONE-ACETAMINOPHEN 5-325 MG PO TABS: 1.0000 | ORAL_TABLET | ORAL | Status: AC | PRN

## 2015-05-01 MED ORDER — FENTANYL CITRATE (PF) 100 MCG/2ML IJ SOLN
INTRAMUSCULAR | Status: DC | PRN
  Administered 2015-05-01 (×2): 50 ug via INTRAVENOUS
  Administered 2015-05-01: 25 ug via INTRAVENOUS

## 2015-05-01 MED ORDER — PROPOFOL 10 MG/ML IV BOLUS
INTRAVENOUS | Status: AC
  Filled 2015-05-01: qty 20

## 2015-05-01 MED ORDER — ONDANSETRON HCL 4 MG/2ML IJ SOLN
INTRAMUSCULAR | Status: DC | PRN
Start: 2015-05-01 — End: 2015-05-01
  Administered 2015-05-01: 4 mg via INTRAVENOUS

## 2015-05-01 MED ORDER — DEXAMETHASONE SODIUM PHOSPHATE 4 MG/ML IJ SOLN
INTRAMUSCULAR | Status: DC | PRN
  Administered 2015-05-01: 10 mg via INTRAVENOUS

## 2015-05-01 MED ORDER — ONDANSETRON HCL 4 MG/2ML IJ SOLN
4.0000 mg | Freq: Once | INTRAMUSCULAR | Status: DC | PRN
  Filled 2015-05-01: qty 2

## 2015-05-01 MED ORDER — BUPIVACAINE HCL 0.25 % IJ SOLN
INTRAMUSCULAR | Status: DC | PRN
  Administered 2015-05-01: 10 mL

## 2015-05-01 MED ORDER — OXYCODONE-ACETAMINOPHEN 5-325 MG PO TABS
ORAL_TABLET | ORAL | Status: AC
  Filled 2015-05-01: qty 1

## 2015-05-01 MED ORDER — OXYCODONE-ACETAMINOPHEN 5-325 MG PO TABS
1.0000 | ORAL_TABLET | ORAL | Status: AC | PRN
  Administered 2015-05-01: 1 via ORAL
  Filled 2015-05-01: qty 2

## 2015-05-01 MED ORDER — DEXAMETHASONE SODIUM PHOSPHATE 10 MG/ML IJ SOLN
INTRAMUSCULAR | Status: AC
  Filled 2015-05-01: qty 1

## 2015-05-01 MED ORDER — PROPOFOL 10 MG/ML IV BOLUS
INTRAVENOUS | Status: DC | PRN
  Administered 2015-05-01: 300 mg via INTRAVENOUS

## 2015-05-01 MED ORDER — CEFAZOLIN SODIUM-DEXTROSE 2-3 GM-% IV SOLR
INTRAVENOUS | Status: AC
  Filled 2015-05-01: qty 50

## 2015-05-01 MED ORDER — LIDOCAINE HCL (CARDIAC) 20 MG/ML IV SOLN
INTRAVENOUS | Status: AC
  Filled 2015-05-01: qty 5

## 2015-05-01 MED ORDER — LIDOCAINE HCL (CARDIAC) 20 MG/ML IV SOLN
INTRAVENOUS | Status: DC | PRN
  Administered 2015-05-01: 100 mg via INTRAVENOUS

## 2015-05-01 MED ORDER — LACTATED RINGERS IV SOLN
INTRAVENOUS | Status: DC
  Administered 2015-05-01: 09:00:00 via INTRAVENOUS
  Filled 2015-05-01: qty 1000

## 2015-05-01 MED ORDER — KETOROLAC TROMETHAMINE 30 MG/ML IJ SOLN
INTRAMUSCULAR | Status: AC
  Filled 2015-05-01: qty 1

## 2015-05-01 SURGICAL SUPPLY — 33 items
BANDAGE CO FLEX L/F 1IN X 5YD (GAUZE/BANDAGES/DRESSINGS) IMPLANT
BLADE SURG 15 STRL LF DISP TIS (BLADE) ×1 IMPLANT
BLADE SURG 15 STRL SS (BLADE) ×2
BNDG CONFORM 2 STRL LF (GAUZE/BANDAGES/DRESSINGS) IMPLANT
BNDG GAUZE ELAST 4 BULKY (GAUZE/BANDAGES/DRESSINGS) ×3 IMPLANT
CLOTH BEACON ORANGE TIMEOUT ST (SAFETY) ×3 IMPLANT
COVER BACK TABLE 60X90IN (DRAPES) ×3 IMPLANT
COVER MAYO STAND STRL (DRAPES) ×3 IMPLANT
DRAPE PED LAPAROTOMY (DRAPES) ×3 IMPLANT
ELECT NEEDLE TIP 2.8 STRL (NEEDLE) IMPLANT
ELECT REM PT RETURN 9FT ADLT (ELECTROSURGICAL) ×3
ELECTRODE REM PT RTRN 9FT ADLT (ELECTROSURGICAL) ×1 IMPLANT
GAUZE PACKING IODOFORM 1X5 (MISCELLANEOUS) ×3 IMPLANT
GAUZE VASELINE 1X8 (GAUZE/BANDAGES/DRESSINGS) IMPLANT
GLOVE BIO SURGEON STRL SZ8 (GLOVE) ×3 IMPLANT
GOWN STRL REUS W/ TWL XL LVL3 (GOWN DISPOSABLE) ×1 IMPLANT
GOWN STRL REUS W/TWL XL LVL3 (GOWN DISPOSABLE) ×2
GOWN W/COTTON TOWEL STD LRG (GOWNS) ×3 IMPLANT
GOWN XL W/COTTON TOWEL STD (GOWNS) ×3 IMPLANT
KIT ROOM TURNOVER WOR (KITS) ×3 IMPLANT
NEEDLE HYPO 22GX1.5 SAFETY (NEEDLE) ×3 IMPLANT
PACK BASIN DAY SURGERY FS (CUSTOM PROCEDURE TRAY) ×3 IMPLANT
PENCIL BUTTON HOLSTER BLD 10FT (ELECTRODE) ×3 IMPLANT
SUPPORT SCROTAL XLRG (MISCELLANEOUS) ×1
SUPPORT SCROTAL XLRG NO STRP (MISCELLANEOUS) ×2 IMPLANT
SUT CHROMIC 4 0 RB 1X27 (SUTURE) IMPLANT
SUT CHROMIC 5 0 RB 1 27 (SUTURE) IMPLANT
SWAB COLLECTION DEVICE MRSA (MISCELLANEOUS) ×3 IMPLANT
SYRINGE CONTROL L 12CC (SYRINGE) ×3 IMPLANT
TOWEL OR 17X24 6PK STRL BLUE (TOWEL DISPOSABLE) ×6 IMPLANT
TUBE CONNECTING 12'X1/4 (SUCTIONS)
TUBE CONNECTING 12X1/4 (SUCTIONS) IMPLANT
TUBE VACUTAINER LIHEP 5M 13X75 (FORM) ×3 IMPLANT

## 2015-05-01 NOTE — Transfer of Care (Signed)
Last Vitals:  Filed Vitals:   05/01/15 0833  BP: 142/93  Pulse: 93  Temp: 37.4 C  Resp: 14    Immediate Anesthesia Transfer of Care Note  Patient: Troy Morales  Procedure(s) Performed: Procedure(s) (LRB): INCISION AND DRAINAGE SUPRAPUBIC ABSCESS (N/A)  Patient Location: PACU  Anesthesia Type: General  Level of Consciousness: awake, alert  and oriented  Airway & Oxygen Therapy: Patient Spontanous Breathing and Patient connected to nasal cannula oxygen  Post-op Assessment: Report given to PACU RN and Post -op Vital signs reviewed and stable  Post vital signs: Reviewed and stable  Complications: No apparent anesthesia complications

## 2015-05-01 NOTE — Anesthesia Postprocedure Evaluation (Signed)
Anesthesia Post Note  Patient: Troy CampiMark Morales  Procedure(s) Performed: Procedure(s) (LRB): INCISION AND DRAINAGE SUPRAPUBIC ABSCESS (N/A)  Patient location during evaluation: PACU Anesthesia Type: General Level of consciousness: awake and alert Pain management: pain level controlled Vital Signs Assessment: post-procedure vital signs reviewed and stable Respiratory status: spontaneous breathing, nonlabored ventilation, respiratory function stable and patient connected to nasal cannula oxygen Cardiovascular status: blood pressure returned to baseline and stable Postop Assessment: no signs of nausea or vomiting Anesthetic complications: no    Last Vitals:  Filed Vitals:   05/01/15 1140 05/01/15 1145  BP:  132/85  Pulse: 87 89  Temp:    Resp: 17 17    Last Pain:  Filed Vitals:   05/01/15 1216  PainSc: 3                  Chanise Habeck JENNETTE

## 2015-05-01 NOTE — H&P (Signed)
Urology History and Physical Exam  CC: the Last  HPI: 49 year old male presents at this time for repeat incision and drainage of a recurrent suprapubic abscess.  The patient had a penile augmentation removed in October, 2016.  This was placed several years earlier by a cosmetic urologist in [place].  The patient had a couple of revisions, and in late October, presented with fever, suprapubic pain and significant swelling.  He underwent removal of this device as well as excision of a significant amount of mesh that was present within the dorsal corporal bodies.  The patient had a drain placed and was treated with doxycycline.  He did growstrep from the culture.  He presented yesterday with recurrent swelling in his suprapubic area as well as significant pain.  He has a fluctuant area and presents at this time for repeat incision and drainage.  PMH: Past Medical History  Diagnosis Date  . Suprapubic abscess     PSH: Past Surgical History  Procedure Laterality Date  . Penile prosthesis implant  and x2 revisions  last one 2012    [place], [place]  . Removal of penile prosthesis N/A 03/31/2015    Procedure: REMOVAL OF PENILE PROSTHESIS;  Surgeon: Marcine Matar, MD;  Location: [facility];  Service: Urology;  Laterality: N/A;    Allergies: Not on File  Medications: Prescriptions prior to admission  Medication Sig Dispense Refill Last Dose  . doxycycline (VIBRA-TABS) 100 MG tablet Take 1 tablet (100 mg total) by mouth 2 (two) times daily. 14 tablet 0 04/30/2015 at Unknown time  . Testosterone 30 MG MISC Place 2 each inside cheek daily.   04/30/2015 at Unknown time  . traMADol (ULTRAM) 50 MG tablet Take 50 mg by mouth every 6 (six) hours as needed.   04/30/2015 at Unknown time  . zolpidem (AMBIEN) 5 MG tablet 10 mg tabs Take 1 po qhs prn insomnia 15 tablet 0 04/30/2015 at Unknown time     Social History: Social History   Social History  . Marital Status: Married   Spouse Name: N/A  . Number of Children: N/A  . Years of Education: N/A   Occupational History  . Not on file.   Social History Main Topics  . Smoking status: Never Smoker   . Smokeless tobacco: Never Used  . Alcohol Use: No  . Drug Use: No  . Sexual Activity: Not on file   Other Topics Concern  . Not on file   Social History Narrative    Family History: History reviewed. No pertinent family history.  Review of Systems: Positive: suprapubic pain, swelling Negative:   A further 10 point review of systems was negative except what is listed in the HPI.                  Physical Exam: @ General: No acute distress.  Awake. Head:  Normocephalic.  Atraumatic. ENT:  EOMI.  Mucous membranes moist Neck:  Supple.  No lymphadenopathy. CV:  S1 present. S2 present. Regular rate. Pulmonary: Equal effort bilaterally.  Clear to auscultation bilaterally. Abdomen: Soft.  none tender to palpation. Skin:  Normal turgor.  No visible rash. Extremity: No gross deformity of bilateral upper extremities.  No gross deformity of                             lower extremities. Neurologic: Alert. Appropriate mood.  Penis:  Significant infrapubic swelling/fluctuance above the penis.  No significant erythema or crepitus.  No lesions. Urethra: Orthotopic meatus. Scrotum: No lesions.  No ecchymosis.  No erythema. Testicles: Descended bilaterally.  No masses bilaterally. Epididymis: Palpable bilaterally. Nontender to palpation.  Studies:  No results for input(s): HGB, WBC, PLT in the last 72 hours.  No results for input(s): NA, K, CL, CO2, BUN, CREATININE, CALCIUM, GFRNONAA, GFRAA in the last 72 hours.  Invalid input(s): MAGNESIUM   No results for input(s): INR, APTT in the last 72 hours.  Invalid input(s): PT   Invalid input(s): ABG    Assessment:  Recurrent suprapubic abscess  Plan: Incision and drainage of suprapubic abscess, placement of packing/drain

## 2015-05-01 NOTE — Anesthesia Procedure Notes (Signed)
Procedure Name: LMA Insertion Date/Time: 05/01/2015 10:38 AM Performed by: Norva PavlovALLAWAY, [name] Pre-anesthesia Checklist: Patient identified, Emergency Drugs available, Suction available and Patient being monitored Patient Re-evaluated:Patient Re-evaluated prior to inductionOxygen Delivery Method: Circle System Utilized Preoxygenation: Pre-oxygenation with 100% oxygen Intubation Type: IV induction Ventilation: Mask ventilation without difficulty LMA: LMA inserted LMA Size: 5.0 Number of attempts: 1 Airway Equipment and Method: bite block Placement Confirmation: positive ETCO2 Tube secured with: Tape Dental Injury: Teeth and Oropharynx as per pre-operative assessment

## 2015-05-01 NOTE — Discharge Instructions (Signed)
Incision and Drainage, Care After Refer to this sheet in the next few weeks. These instructions provide you with information on caring for yourself after your procedure. Your caregiver may also give you more specific instructions. Your treatment has been planned according to current medical practices, but problems sometimes occur. Call your caregiver if you have any problems or questions after your procedure. HOME CARE INSTRUCTIONS   If antibiotic medicine is given, take it as directed. Finish it even if you start to feel better.  Only take over-the-counter or prescription medicines for pain, discomfort, or fever as directed by your caregiver.  Keep all follow-up appointments as directed by your caregiver.  Change any bandages (dressings) as directed by your caregiver. Replace old dressings with clean dressings.  Wash your hands before and after caring for your wound. You will receive specific instructions for cleansing and caring for your wound.  SEEK MEDICAL CARE IF:   You have increased pain, swelling, or redness around the wound.  You have increased drainage, smell, or bleeding from the wound.  You have muscle aches, chills, or you feel generally sick.  You have a fever. MAKE SURE YOU:   Understand these instructions.  Will watch your condition.  Will get help right away if you are not doing well or get worse.   This information is not intended to replace advice given to you by your health care provider. Make sure you discuss any questions you have with your health care provider.   Document Released: 08/09/2011 Document Revised: 06/07/2014 Document Reviewed: 08/09/2011 Elsevier Interactive Patient Education 2016 [employer]    Post Anesthesia Home Care Instructions  Activity: Get plenty of rest for the remainder of the day. A responsible adult should stay with you for 24 hours following the procedure.  For the next 24 hours, DO NOT: -Drive a car -Social workerperate  machinery -Drink alcoholic beverages -Take any medication unless instructed by your physician -Make any legal decisions or sign important papers.  Meals: Start with liquid foods such as gelatin or soup. Progress to regular foods as tolerated. Avoid greasy, spicy, heavy foods. If nausea and/or vomiting occur, drink only clear liquids until the nausea and/or vomiting subsides. Call your physician if vomiting continues.  Special Instructions/Symptoms: Your throat may feel dry or sore from the anesthesia or the breathing tube placed in your throat during surgery. If this causes discomfort, gargle with warm salt water. The discomfort should disappear within 24 hours.  If you had a scopolamine patch placed behind your ear for the management of post- operative nausea and/or vomiting:  1. The medication in the patch is effective for 72 hours, after which it should be removed.  Wrap patch in a tissue and discard in the trash. Wash hands thoroughly with soap and water. 2. You may remove the patch earlier than 72 hours if you experience unpleasant side effects which may include dry mouth, dizziness or visual disturbances. 3. Avoid touching the patch. Wash your hands with soap and water after contact with the patch.

## 2015-05-01 NOTE — Op Note (Signed)
Preoperative diagnosis: Recurrent suprapubic infection status post removal of infected he now augmentation  Postoperative diagnosis: Same  Principal procedure: Incision and drainage of suprapubic abscess  Surgeon: Lizzy Hamre  Anesthesia: Gen. with LMA  Complications: None  Specimens: Cultures of suprapubic abscess  Brief history: 49 year old male who presented just over a month ago with an infected augmentation of his penis. This was a silicone device which the patient had [place] years ago [place]. The patient had a significant infection with over 200 mL of pus drained. He had an appropriate drain placed, and was treated for his infection with 7 days of antibiotics postoperatively. The drain was removed about a week out. The patient presented yesterday with increased pain and suprapubic swelling consistent with a recurrent infection. He presents at this time for incision and drainage.  Description of procedure: The patient was properly identified in the holding area. He received preoperative IV antibiotics/Ancef. He was taken to the operating room where general anesthetic was administered with the LMA. He is placed in the supine position. Genitalia and perineum were prepped and draped, proper timeout was performed.  A 3 cm incision was made in a transverse direction approximate 2 cm superior to his penile base in the midline. This was carried down to the abscess cavity with electrocautery. A significant amount of pus was drained-this was mildly purulent, partially clear. This was cultured both for anaerobic and aerobic as well as fungal organisms. I inspected the cavity. There was a tiny amount of mesh still present on his dorsal corporal bodies. This was excised as much as possible. This tissue was quite friable. I did not when he get deep into the corporal tissue, due to the dorsal location of the nerves. However, from what I can see, the majority/entirety of the mesh  was removed at this point. No other foreign bodies were seen within the abscess cavity which was then irrigated copiously with 1 L of antibiotic solution. I felt it best to leave the wound open, and placed 1 inch iodoform gauze within the cavity. Skin edges were then infiltrated with quarter percent plain Marcaine, 10 mL. Compressive dressing was then placed. The patient was then awakened and taken to the PACU in stable condition. He tolerated the procedure well.

## 2015-05-01 NOTE — Anesthesia Preprocedure Evaluation (Addendum)
Anesthesia Evaluation  Patient identified by MRN, date of birth, ID band Patient awake    Reviewed: Allergy & Precautions, NPO status , Patient's Chart, lab work & pertinent test results  History of Anesthesia Complications Negative for: history of anesthetic complications  Airway Mallampati: III  TM Distance: >3 FB Neck ROM: Full    Dental no notable dental hx. (+) Dental Advisory Given   Pulmonary neg pulmonary ROS,    Pulmonary exam normal breath sounds clear to auscultation       Cardiovascular negative cardio ROS Normal cardiovascular exam Rhythm:Regular Rate:Normal     Neuro/Psych negative neurological ROS  negative psych ROS   GI/Hepatic negative GI ROS, Neg liver ROS,   Endo/Other  obesity  Renal/GU negative Renal ROS  negative genitourinary   Musculoskeletal negative musculoskeletal ROS (+)   Abdominal   Peds negative pediatric ROS (+)  Hematology negative hematology ROS (+)   Anesthesia Other Findings   Reproductive/Obstetrics negative OB ROS                            Anesthesia Physical Anesthesia Plan  ASA: II  Anesthesia Plan: General   Post-op Pain Management:    Induction: Intravenous  Airway Management Planned: LMA  Additional Equipment:   Intra-op Plan:   Post-operative Plan: Extubation in OR  Informed Consent: I have reviewed the patients History and Physical, chart, labs and discussed the procedure including the risks, benefits and alternatives for the proposed anesthesia with the patient or authorized representative who has indicated his/her understanding and acceptance.   Dental advisory given  Plan Discussed with: CRNA  Anesthesia Plan Comments:         Anesthesia Quick Evaluation

## 2015-05-02 ENCOUNTER — Encounter (HOSPITAL_BASED_OUTPATIENT_CLINIC_OR_DEPARTMENT_OTHER): Payer: Self-pay | Admitting: Urology

## 2015-05-05 LAB — CULTURE, ROUTINE-ABSCESS: Culture: NO GROWTH

## 2015-05-06 LAB — ANAEROBIC CULTURE

## 2015-05-27 LAB — FUNGUS CULTURE W SMEAR: FUNGAL SMEAR: NONE SEEN

## 2016-01-20 ENCOUNTER — Other Ambulatory Visit: Payer: Self-pay | Admitting: Orthopedic Surgery

## 2016-12-07 IMAGING — DX DG CHEST 1V
1 series · 1 of 1 positions shown · non-contrast
Comparison: None.

CLINICAL DATA: Fever and chills.

EXAM:
CHEST 1 VIEW

[chest ap]
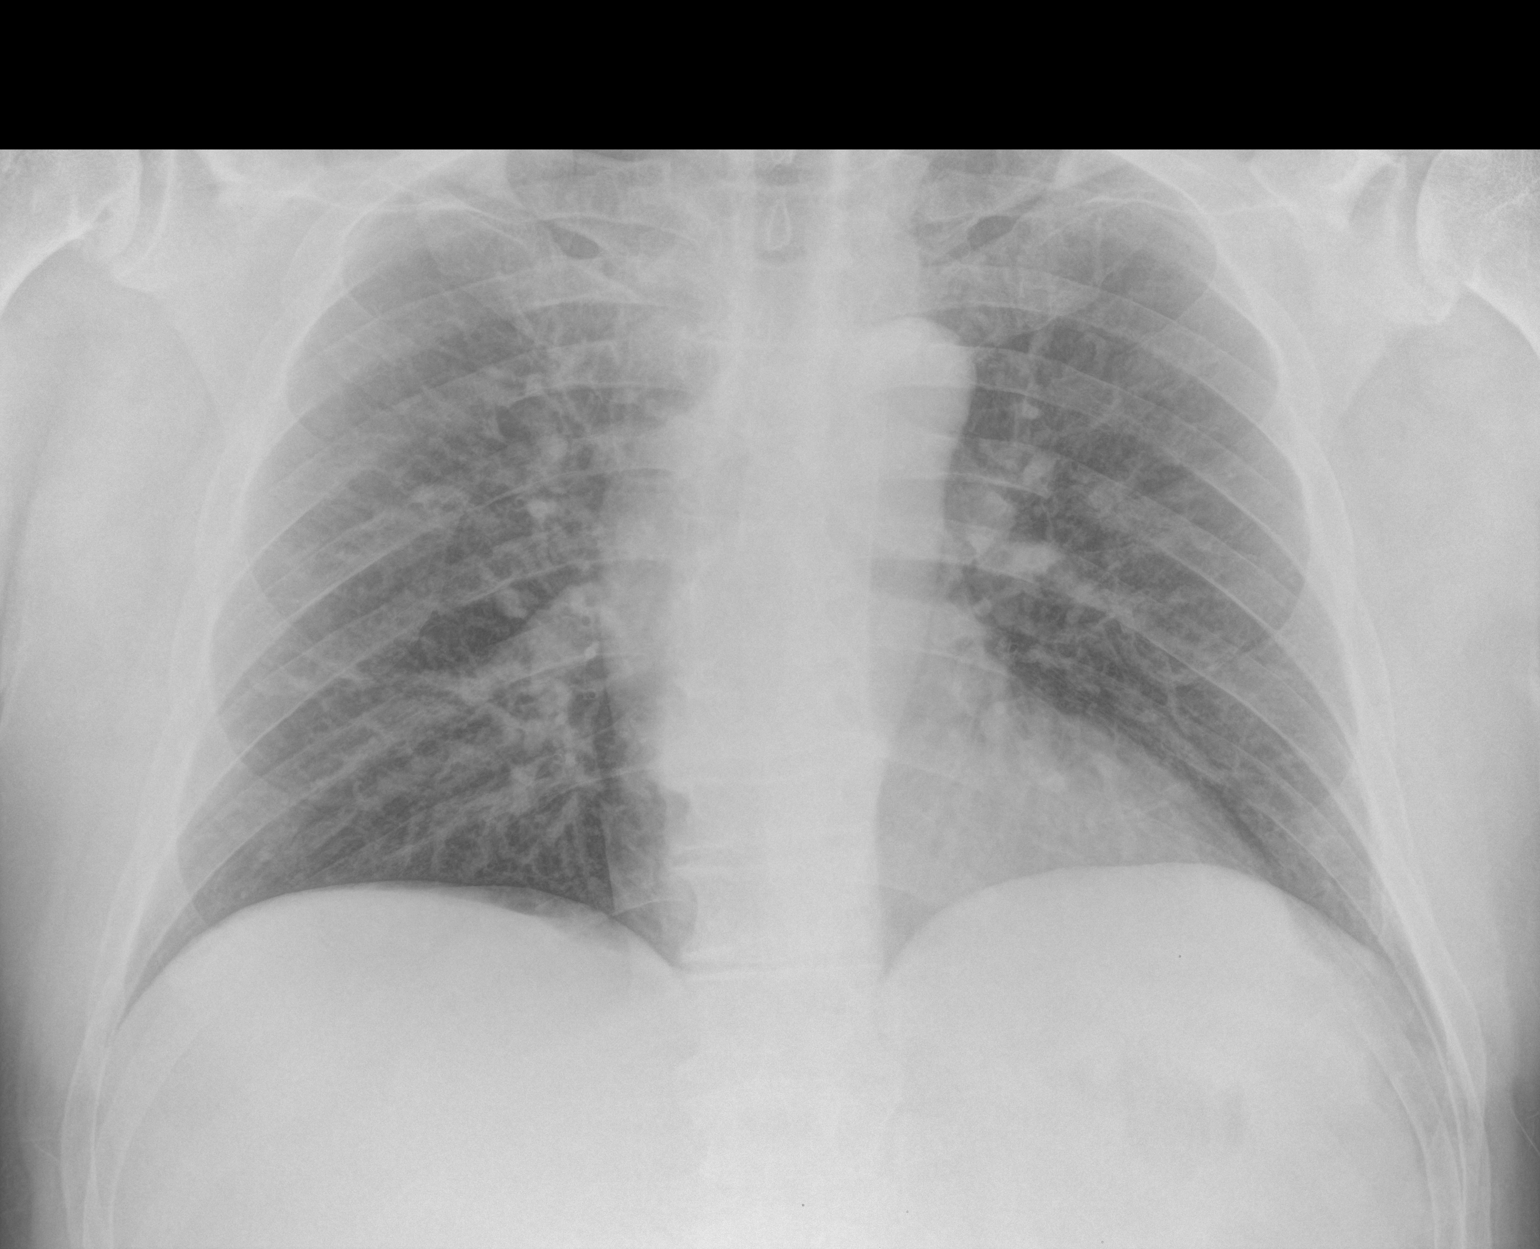

[1 of 1 positions shown; findings below may reference images not displayed]

FINDINGS: Mediastinum and hilar structures are normal. Cardiomegaly with
normal pulmonary vascularity. Mild bilateral interstitial prominence
noted. Mild pneumonitis cannot be excluded. No pleural effusion or
pneumothorax.
IMPRESSION: 1. Mild bilateral interstitial prominence. Mild pneumonitis cannot
be excluded.
2. Cardiomegaly with normal pulmonary vascularity.
